# Patient Record
Sex: Male | Born: 1970 | Race: Black or African American | Hispanic: No | Marital: Single | Smoking: Current every day smoker
Health system: Southern US, Community
[De-identification: ages and names within clinical notes are randomized; demographics above are authoritative.]

## PROBLEM LIST (undated history)

## (undated) DIAGNOSIS — M5136 Other intervertebral disc degeneration, lumbar region: Secondary | ICD-10-CM

---

## 2000-01-12 ENCOUNTER — Emergency Department (HOSPITAL_COMMUNITY): Admission: EM | Admit: 2000-01-12 | Discharge: 2000-01-13 | Payer: Self-pay | Admitting: Emergency Medicine

## 2000-01-12 ENCOUNTER — Encounter: Payer: Self-pay | Admitting: Emergency Medicine

## 2002-05-16 ENCOUNTER — Emergency Department (HOSPITAL_COMMUNITY): Admission: EM | Admit: 2002-05-16 | Discharge: 2002-05-16 | Payer: Self-pay | Admitting: Emergency Medicine

## 2003-10-14 ENCOUNTER — Emergency Department (HOSPITAL_COMMUNITY): Admission: EM | Admit: 2003-10-14 | Discharge: 2003-10-14 | Payer: Self-pay | Admitting: Emergency Medicine

## 2016-06-11 ENCOUNTER — Emergency Department (HOSPITAL_COMMUNITY)
Admission: EM | Admit: 2016-06-11 | Discharge: 2016-06-11 | Disposition: A | Discharge status: Home / Self Care | Payer: Self-pay | Attending: Dermatology | Admitting: Dermatology

## 2016-06-11 ENCOUNTER — Encounter (HOSPITAL_COMMUNITY): Payer: Self-pay | Admitting: Emergency Medicine

## 2016-06-11 DIAGNOSIS — Y939 Activity, unspecified: Secondary | ICD-10-CM | POA: Insufficient documentation

## 2016-06-11 DIAGNOSIS — Z5321 Procedure and treatment not carried out due to patient leaving prior to being seen by health care provider: Secondary | ICD-10-CM | POA: Insufficient documentation

## 2016-06-11 DIAGNOSIS — Y990 Civilian activity done for income or pay: Secondary | ICD-10-CM | POA: Insufficient documentation

## 2016-06-11 DIAGNOSIS — F17200 Nicotine dependence, unspecified, uncomplicated: Secondary | ICD-10-CM | POA: Insufficient documentation

## 2016-06-11 DIAGNOSIS — T1591XA Foreign body on external eye, part unspecified, right eye, initial encounter: Secondary | ICD-10-CM | POA: Insufficient documentation

## 2016-06-11 DIAGNOSIS — Y929 Unspecified place or not applicable: Secondary | ICD-10-CM | POA: Insufficient documentation

## 2016-06-11 DIAGNOSIS — X58XXXA Exposure to other specified factors, initial encounter: Secondary | ICD-10-CM | POA: Insufficient documentation

## 2016-06-11 HISTORY — DX: Other intervertebral disc degeneration, lumbar region: M51.36

## 2016-06-11 NOTE — ED Triage Notes (Signed)
Pt. reports foreign object hit his right eye this afternoon while while working on a project , denies vision loss, presents with redness/teary right eye .

## 2016-06-11 NOTE — ED Notes (Signed)
Pt threw stuff on desk and said he is gone, he will be back another day.

## 2016-06-12 ENCOUNTER — Emergency Department (HOSPITAL_COMMUNITY): Payer: Self-pay

## 2016-06-12 ENCOUNTER — Emergency Department (HOSPITAL_COMMUNITY)
Admission: EM | Admit: 2016-06-12 | Discharge: 2016-06-12 | Disposition: A | Discharge status: Home / Self Care | Payer: Self-pay | Attending: Emergency Medicine | Admitting: Emergency Medicine

## 2016-06-12 ENCOUNTER — Encounter (HOSPITAL_COMMUNITY): Payer: Self-pay | Admitting: Emergency Medicine

## 2016-06-12 DIAGNOSIS — G9389 Other specified disorders of brain: Secondary | ICD-10-CM

## 2016-06-12 DIAGNOSIS — W228XXA Striking against or struck by other objects, initial encounter: Secondary | ICD-10-CM | POA: Insufficient documentation

## 2016-06-12 DIAGNOSIS — G939 Disorder of brain, unspecified: Secondary | ICD-10-CM | POA: Insufficient documentation

## 2016-06-12 DIAGNOSIS — T1591XA Foreign body on external eye, part unspecified, right eye, initial encounter: Secondary | ICD-10-CM | POA: Insufficient documentation

## 2016-06-12 DIAGNOSIS — F17200 Nicotine dependence, unspecified, uncomplicated: Secondary | ICD-10-CM | POA: Insufficient documentation

## 2016-06-12 DIAGNOSIS — Y999 Unspecified external cause status: Secondary | ICD-10-CM | POA: Insufficient documentation

## 2016-06-12 DIAGNOSIS — Y929 Unspecified place or not applicable: Secondary | ICD-10-CM | POA: Insufficient documentation

## 2016-06-12 DIAGNOSIS — Y9389 Activity, other specified: Secondary | ICD-10-CM | POA: Insufficient documentation

## 2016-06-12 MED ORDER — LORAZEPAM 2 MG/ML IJ SOLN
1.0000 mg | Freq: Once | INTRAMUSCULAR | Status: AC
  Administered 2016-06-12: 1 mg via INTRAVENOUS
  Filled 2016-06-12: qty 1

## 2016-06-12 MED ORDER — HYDROCODONE-ACETAMINOPHEN 5-325 MG PO TABS: 2.0000 | ORAL_TABLET | ORAL | 0 refills | Status: AC | PRN

## 2016-06-12 MED ORDER — ERYTHROMYCIN 5 MG/GM OP OINT: TOPICAL_OINTMENT | OPHTHALMIC | 0 refills | Status: AC

## 2016-06-12 MED ORDER — LIDOCAINE HCL (PF) 1 % IJ SOLN
5.0000 mL | Freq: Once | INTRAMUSCULAR | Status: AC
  Administered 2016-06-12: 5 mL
  Filled 2016-06-12: qty 30

## 2016-06-12 NOTE — ED Notes (Signed)
Patient transported to CT 

## 2016-06-12 NOTE — ED Provider Notes (Signed)
[facility]-EMERGENCY DEPT Provider Note   CSN: [id number] Arrival date & time: 06/12/16  1032  By signing my name below, I, Sonum Patel, attest that this documentation has been prepared under the direction and in the presence of Ponciano Ort McIntosh, [place] . Electronically Signed: Sonum Patel, Neurosurgeon. 06/12/16. 1:20 PM.  History   Chief Complaint Chief Complaint  Patient presents with  . Eye Injury    right     The history is provided by the patient. No language interpreter was used.     HPI Comments: Alex Long is a 46 y.o. male who presents to the Emergency Department complaining of a right eye injury that occurred yesterday. Patient states he was hammering wood when he was struck with a piece of wood. He complains of pain with associated redness to the right eye and eyelid. He states it feels as though there is something in his eye. He denies any modifying factors.   Past Medical History:  Diagnosis Date  . DDD (degenerative disc disease), lumbar     There are no active problems to display for this patient.   History reviewed. No pertinent surgical history.     Home Medications    Prior to Admission medications   Not on File    Family History No family history on file.  Social History Social History  Substance Use Topics  . Smoking status: Current Every Day Smoker  . Smokeless tobacco: Never Used  . Alcohol use Yes     Allergies   Bee venom and Tramadol   Review of Systems Review of Systems  Eyes: Positive for pain and redness. Negative for photophobia, discharge and visual disturbance.  Neurological: Negative for syncope and headaches.  All other systems reviewed and are negative.    Physical Exam Updated Vital Signs BP 113/90   Pulse 73   Temp 98.4 F (36.9 C)   Resp 16   SpO2 100%   Physical Exam  Constitutional: He is oriented to person, place, and time. He appears well-developed and well-nourished.  HENT:  Head: Normocephalic and  atraumatic.  Eyes: EOM are normal. Pupils are equal, round, and reactive to light. Foreign body present in the right eye.  Right eye: Blue-ish foreign body at approximately 9 o'clock, outside of iris area   Cardiovascular: Normal rate.   Pulmonary/Chest: Effort normal.  Neurological: He is alert and oriented to person, place, and time.  Skin: Skin is warm and dry.  Psychiatric: He has a normal mood and affect.  Nursing note and vitals reviewed.    ED Treatments / Results  DIAGNOSTIC STUDIES: Oxygen Saturation is 100% on RA, normal by my interpretation.    COORDINATION OF CARE: 1:21 PM Discussed treatment plan with pt at bedside and pt agreed to plan.    Labs (all labs ordered are listed, but only abnormal results are displayed) Labs Reviewed - No data to display  EKG  EKG Interpretation None       Radiology Dg Orbits  Result Date: 06/12/2016 CLINICAL DATA:  Right eye injury. EXAM: ORBITS - COMPLETE 4+ VIEW COMPARISON:  No prior. FINDINGS: Radiopaque densities are noted in the right foot consistent with foreign body. No acute bony abnormality identified. Paranasal sinuses clear. IMPRESSION: Small radiopaque densities noted in the right orbit consistent with foreign bodies. These could be intra-ocular. These results will be called to the ordering clinician or representative by the Radiologist Assistant, and communication documented in the PACS or zVision Dashboard. Electronically Signed  ByMaisie Fus: Thomas  Register   On: 06/12/2016 14:00   Ct Orbits Wo Contrast  Result Date: 06/12/2016 CLINICAL DATA:  Foreign body in the right globe. EXAM: CT ORBITS WITHOUT CONTRAST TECHNIQUE: Multidetector CT images were obtained using the standard protocol without intravenous contrast. COMPARISON:  None. FINDINGS: Orbits: A metallic foreign body measuring 2.7 mm is evident along the lateral aspect of the lens insertion and the right globe. The globe appears to be intact. The lens is located. The optic  nerves are within normal limits. The intraorbital musculature is normal. Visualized sinuses: A small polyp or mucous retention cyst is seen along the lateral aspect of the left maxillary sinus. There is minimal mucosal thickening along the floor of the left maxillary sinus. A small polyp or mucous retention cyst is present at the anterior right maxillary sinus. The ostiomeatal complex is patent bilaterally. Concha bullosa are present in the middle turbinates. The nasal cavity is clear. Soft tissues: The periorbital soft tissues are otherwise within normal limits. Limited intracranial: A low-density region is present in the left frontal lobe. This appears to be parenchymal but is incompletely imaged. IMPRESSION: 1. 2.7 mm metallic foreign body along the lateral aspect of the right globe near the insertion of the lens. 2. 3.5 cm hypodensity in the left frontal lobe. This may reflect a remote infarct or less likely low-grade tumor. Arachnoid cyst could have this appearance. Recommend MRI of the brain without and with contrast following removal of the metallic foreign body. Electronically Signed   By: Marin Robertshristopher  Mattern M.D.   On: 06/12/2016 15:56    Procedures Procedures (including critical care time)  Medications Ordered in ED Medications - No data to display   Initial Impression / Assessment and Plan / ED Course  I have reviewed the triage vital signs and the nursing notes.  Pertinent labs & imaging results that were available during my care of the patient were reviewed by me and considered in my medical decision making (see chart for details).  Clinical Course   Pt counseled on foreign body.  Pt advised of findings of brain abnormality.      Final Clinical Impressions(s) / ED Diagnoses   Final diagnoses:  Foreign body, eye, right, initial encounter  Foreign body of right eye, initial encounter  Brain mass  Dr. Freida BusmanAllen in to see. Dr. Genia [facility] here to see to remove foreign body.    I discussed need for MRi with pt.  Pt wants to have done outpatient.  He will call and schedule appointment.   Pt advised to return to ED if any problems.   New Prescriptions There are no discharge medications for this patient.  No outpatient prescriptions have been marked as taking for the 06/12/16 encounter [facility](Hospital Encounter).   I personally performed the services in this documentation, which was scribed in my presence.  The recorded information has been reviewed and considered.   Barnet PallKaren SofiaPAC. An After Visit Summary was printed and given to the patient.    Lonia SkinnerLeslie K AlbionSofia, PA-C 06/12/16 1922    Lorre NickAnthony Allen, MD 06/15/16 1045

## 2016-06-12 NOTE — ED Notes (Signed)
Opthalmology at bedside.

## 2016-06-12 NOTE — Discharge Instructions (Signed)
You need to have a MRi with and without contrast to evaluate the area seen today on your brain.   If you can not get scheduled, return here to have done.

## 2016-06-12 NOTE — Consult Note (Signed)
Chief Complaint  Patient presents with  . Eye Injury    right   :       Ophthalmology HPI: This is a 46 y.o.  male with a past ocular history listed below that presents with eye pain, blurry vision and red eye after a piece of metal hit is right eye. Patient was hammering metal and a piece hit is right eye two days ago. He has had a little blurry vision. He has constant fbs.  This happened yesterday.   Denies flashes of light, floaters, double vision, curtains coming over vision.    Past Ocular History: Refractive error    Last Eye Exam:  2 years ago    Primary Eye Care:  Unknown    Past Medical History:  Diagnosis Date  . DDD (degenerative disc disease), lumbar      History reviewed. No pertinent surgical history.   Social History   Social History  . Marital status: Single    Spouse name: N/A  . Number of children: N/A  . Years of education: N/A   Occupational History  . Not on file.   Social History Main Topics  . Smoking status: Current Every Day Smoker  . Smokeless tobacco: Never Used  . Alcohol use Yes  . Drug use:     Types: Marijuana  . Sexual activity: Not on file   Other Topics Concern  . Not on file   Social History Narrative  . No narrative on file     Allergies  Allergen Reactions  . Bee Venom   . Tramadol      No current facility-administered medications on file prior to encounter.    No current outpatient prescriptions on file prior to encounter.     Review of Systems  Constitutional: Negative.   Eyes: Positive for blurred vision, pain and redness.  All other systems reviewed and are negative.     Exam:  General: Awake, Alert, Oriented *3  Vision (near): without correction   OD: 4 point  OS: 3 point  Confrontational Field:   Full to count fingers, both eyes  Extraocular Motility:  Full ductions and versions, both eyes    External:   Normal Symmetry, V1-3 intact, infraorbital nerve appears intact.     Pupils  OD: 4mm to 3mm reactive without afferent pupillary defect (APD)  OS: 4mm to 3mm reactive without afferent pupillary defect (APD)   IOP(tonopen)  OD: 16  OS: 15  Slit Lamp Exam:  Lids/Lashes  OD: Normal Lids and lashes, No lesion or injury  OS: Normal lids and lashes, nor lesion or injury  Conjucntiva/Sclera  OD:1+ injection, 2.64mm subconj metalic foreign bodytemporal conj, 3mm from limbus  OS: White and quiet  Cornea  OD: Clear without abrasion or defect  OS: Clear without abrasion or defect  Anterior Chamber  OD: Deep and quiet  OS: Deep and quiet  Iris  OD: Normal iris architecture  OS: Normal Iris Architecture   Lens  OD: Clear, Without significant opacities  OS: Clear, Without significant opacities  Anterior Vitreous  OD: Clear, without cell  OS: Clear without cell   POSTERIOR POLE EXAM (Dialated with phenylephrine and tropicamide.Dilation may last up to 24 hours)  View:   OD: 20/20 view without opacities  OS: 20/20 view without opacities  Vitreous:   OD: Clear, no cell  OS: Clear, no cell  Disc:   OD: flat, sharp margin, with appropriate color   NO DISC EDEMA, +SVP  OS:  flat, sharp margin, with appropriate color   No DISC EDEMA, +SVP  C:D Ratio:   OD: 0.2   OS: 0.2  Macula  OD: Flat, with appropriate light reflex  OS: Flat with appropriate light reflex  Vessels  OD: Normal vasculature  OS: Normal vasculature  Periphery  OD: Flat 360 degrees without tear, hole or detachment  OS: Flat 360 degrees without tear, hole or detachment    CT Scan:  - Conjucntival metalic foreign body without intraocular involvement.  -Incidental Frontal lobe mass detected. See Radiology for full report.    Assessment and Plan:   This is 46 y.o.  male with conjunctival foreign body right eye.  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    Conj Foreign Body - Discussed iagnosis, prognosis, and treatment options. Recommend  removal for conjunctival foreign body.  -Conj foreign body removed without incident (see below procedure note).  -Erythromycin ointment TID x 7 days Remove patch in the morning.  - Follow up outpatient    Frontal Lobe Mass - No evidence of increased intracranial pressure on opthalmic exam - Recommend MRI and managemetn per primary team, will defer to ED for further work up.     Procedure Note Patient was identified and correct sight was marked. The eye was prepped in the usual sterile fashion. A retrobulbar injection of lidocaine was injected in to the retrobulbar space for adequate anesthesia and akinesia. A lid speculum was placed into the eye.Wescot scissors were used to incise the conjucntiva. Toothed forceps were used to free and remove a 2.985mm metalic foreign body.  Hemostasis was achieved. Erythromycin ointment was placed into the eye and the eye was patched.   Instructed to remove patch in

## 2016-06-12 NOTE — ED Triage Notes (Signed)
Pt reports right eye injury , believes foreign object , piece of wood per pt. Presents with obvious redness . Reports blurry vision since incident.

## 2016-06-21 ENCOUNTER — Ambulatory Visit (HOSPITAL_COMMUNITY): Admission: RE | Admit: 2016-06-21 | Payer: Self-pay | Source: Ambulatory Visit

## 2016-06-28 ENCOUNTER — Ambulatory Visit (HOSPITAL_COMMUNITY): Admission: RE | Admit: 2016-06-28 | Payer: Self-pay | Source: Ambulatory Visit

## 2018-07-12 IMAGING — CT CT ORBITS W/O CM
3 series · 14 of 47 positions shown, 16 images · non-contrast
Comparison: None.

CLINICAL DATA: Foreign body in the right globe.

EXAM:
CT ORBITS WITHOUT CONTRAST
TECHNIQUE: Multidetector CT images were obtained using the standard protocol
without intravenous contrast.

[Series 3: orbits st · axial · 0.23mm/px · z∈[-43,+33]mm · 8 of 46 slices shown, 10 images]
[im 4/46  brain]
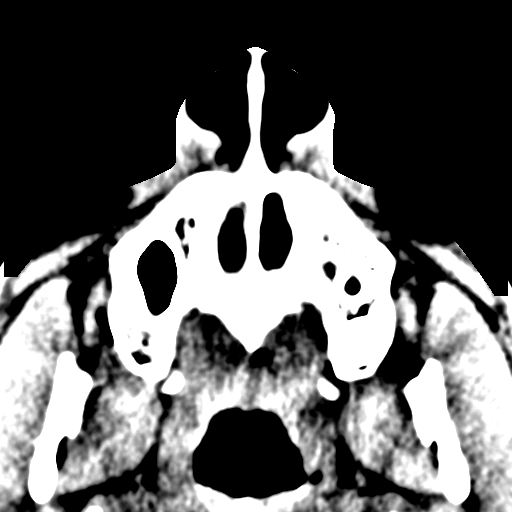
[im 4/46  bone]
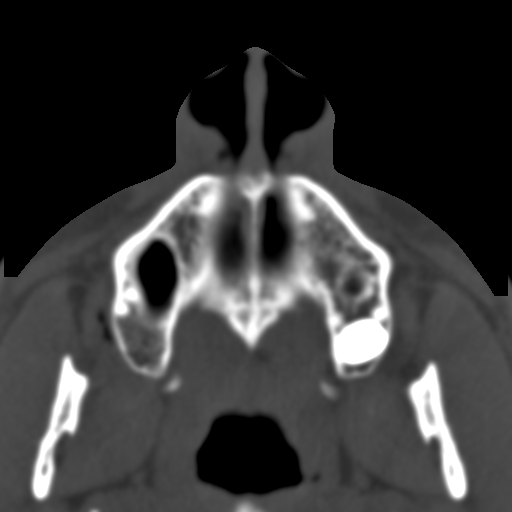
[im 10/46  bone]
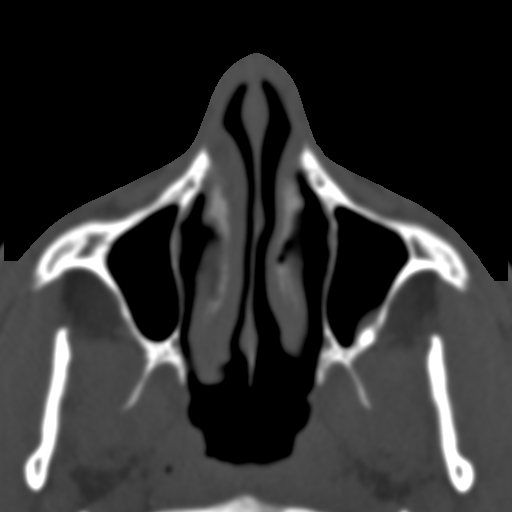
[im 14/46  bone]
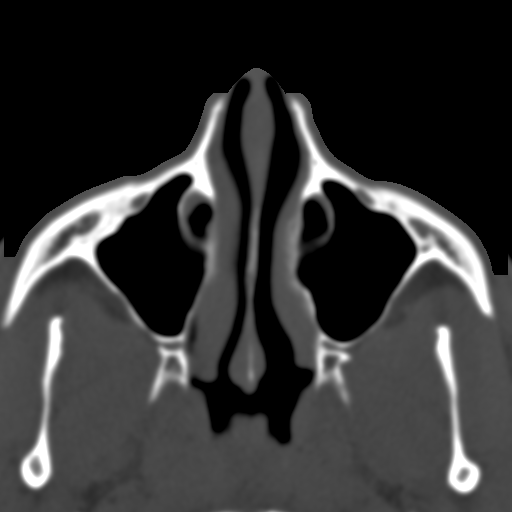
[im 21/46  bone]
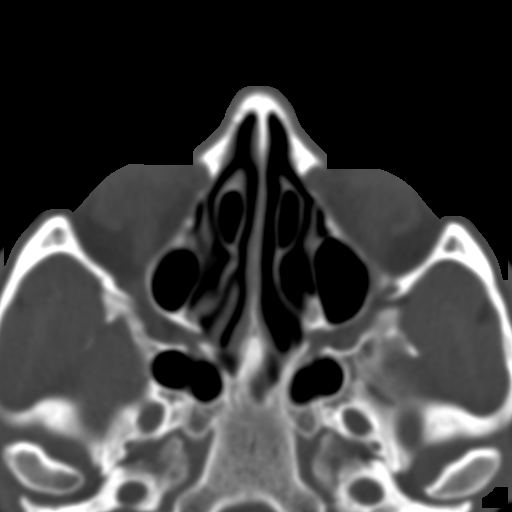
[im 25/46  brain]
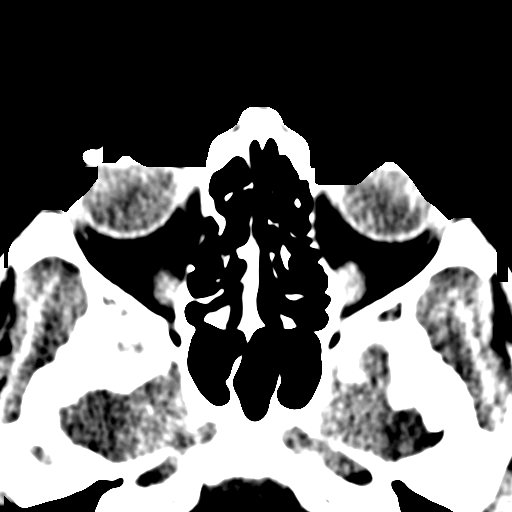
[im 25/46  bone]
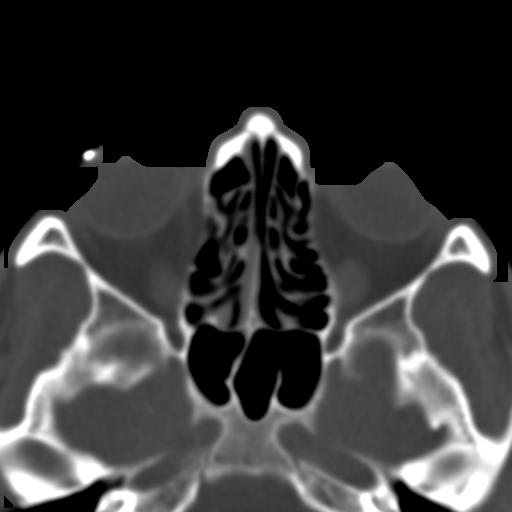
[im 32/46  bone]
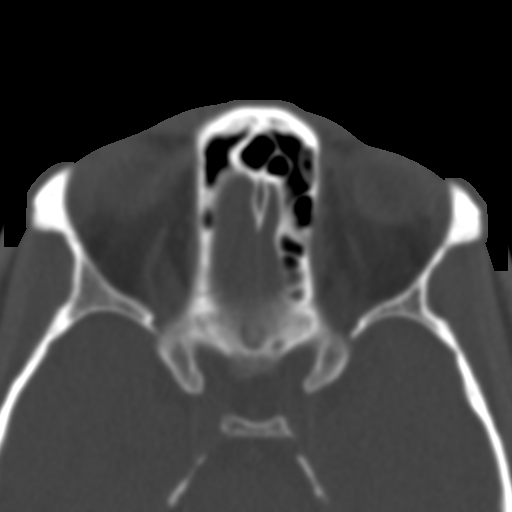
[im 36/46  bone]
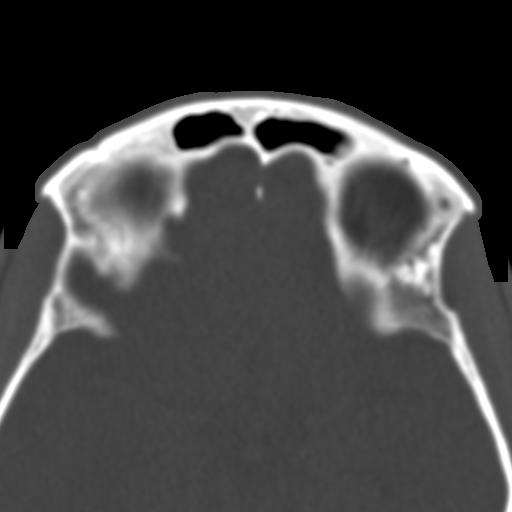
[im 42/46  bone]
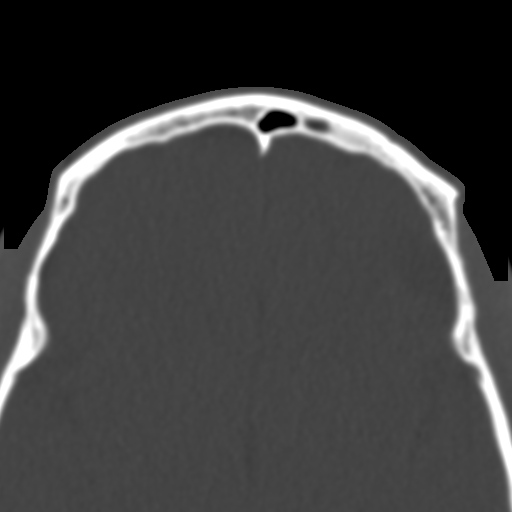

[Series 5: coronal st · coronal · 0.21mm/px · 3 of 76 slices shown]
[im 26/76  bone]
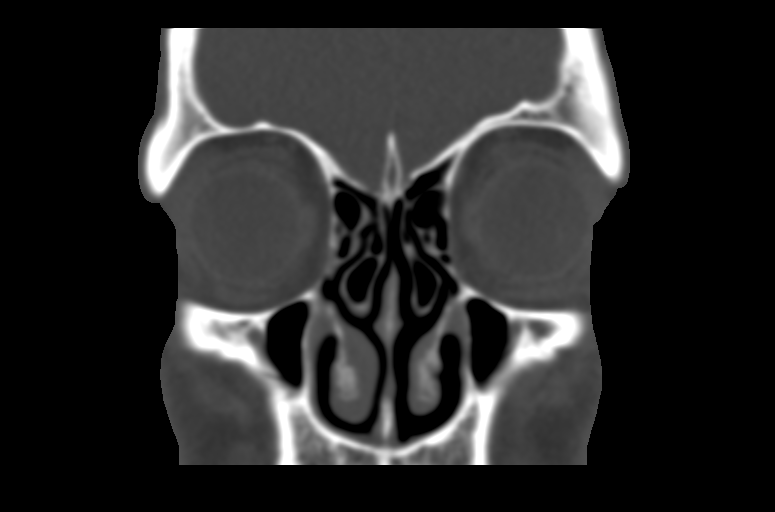
[im 34/76  bone]
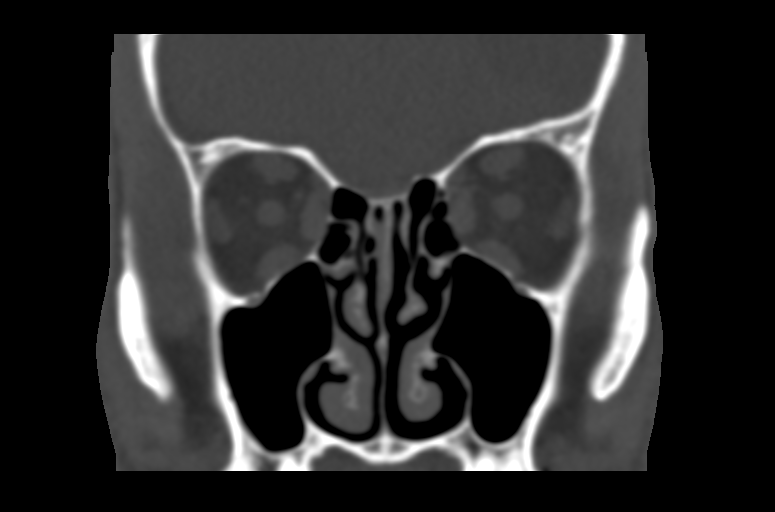
[im 42/76  bone]
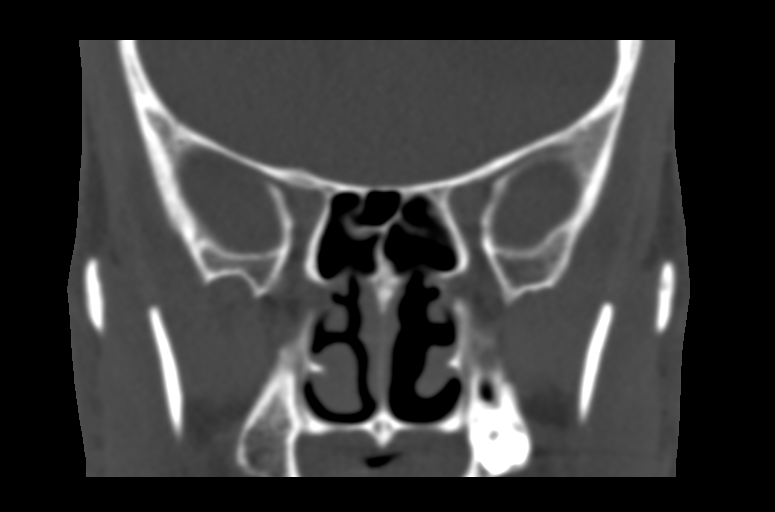

[Series 6: sagittal st · sagittal · 0.20mm/px · 3 of 76 slices shown]
[im 26/76  bone]
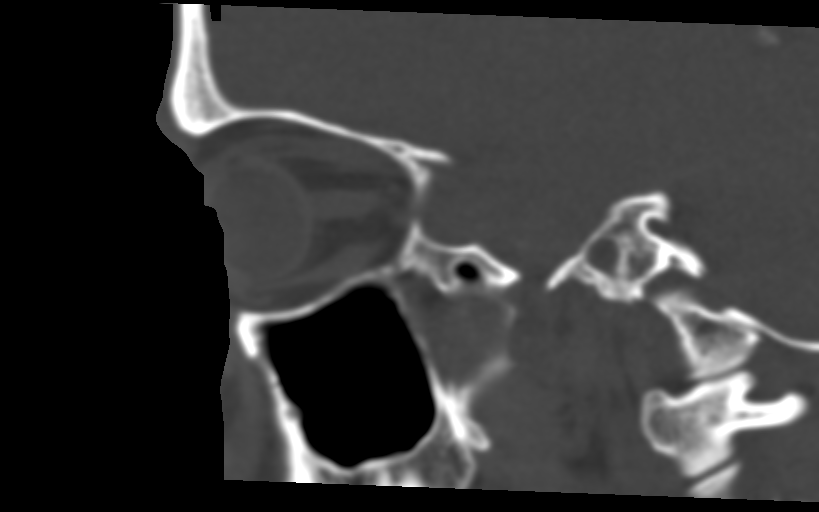
[im 38/76  bone]
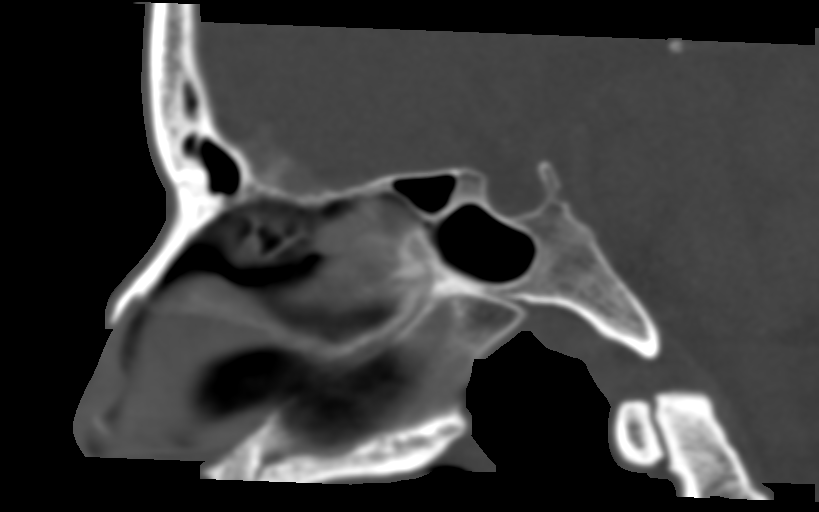
[im 51/76  bone]
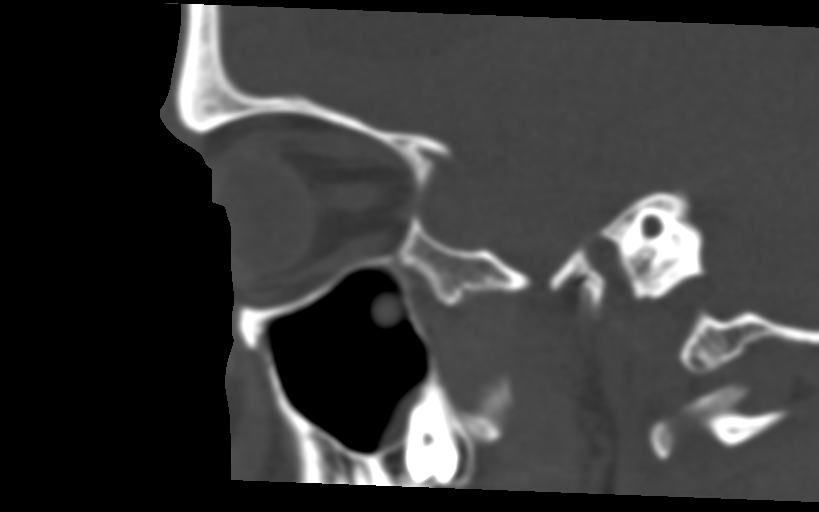

[14 of 47 positions shown; findings below may reference images not displayed]

FINDINGS: Orbits: A metallic foreign body measuring 2.7 mm is evident along
the lateral aspect of the lens insertion and the right globe. The
globe appears to be intact. The lens is located. The optic nerves
are within normal limits. The intraorbital musculature is normal.

Visualized sinuses: A small polyp or mucous retention cyst is seen
along the lateral aspect of the left maxillary sinus. There is
minimal mucosal thickening along the floor of the left maxillary
sinus. A small polyp or mucous retention cyst is present at the
anterior right maxillary sinus. The ostiomeatal complex is patent
bilaterally. Concha bullosa are present in the middle turbinates.
The nasal cavity is clear.

Soft tissues: The periorbital soft tissues are otherwise within
normal limits.

Limited intracranial: A low-density region is present in the left
frontal lobe. This appears to be parenchymal but is incompletely
imaged.
IMPRESSION: 1. 2.7 mm metallic foreign body along the lateral aspect of the
right globe near the insertion of the lens.
2. 3.5 cm hypodensity in the left frontal lobe. This may reflect a
remote infarct or less likely low-grade tumor. Arachnoid cyst could
have this appearance. Recommend MRI of the brain without and with
contrast following removal of the metallic foreign body.

## 2020-01-18 ENCOUNTER — Encounter (HOSPITAL_COMMUNITY): Payer: Self-pay

## 2020-01-18 ENCOUNTER — Emergency Department (HOSPITAL_COMMUNITY): Payer: PRIVATE HEALTH INSURANCE

## 2020-01-18 ENCOUNTER — Other Ambulatory Visit: Payer: Self-pay

## 2020-01-18 ENCOUNTER — Emergency Department (HOSPITAL_COMMUNITY)
Admission: EM | Admit: 2020-01-18 | Discharge: 2020-01-18 | Disposition: A | Discharge status: Home / Self Care | Payer: PRIVATE HEALTH INSURANCE | Attending: Emergency Medicine | Admitting: Emergency Medicine

## 2020-01-18 DIAGNOSIS — R52 Pain, unspecified: Secondary | ICD-10-CM | POA: Insufficient documentation

## 2020-01-18 DIAGNOSIS — R5383 Other fatigue: Secondary | ICD-10-CM | POA: Insufficient documentation

## 2020-01-18 DIAGNOSIS — R6883 Chills (without fever): Secondary | ICD-10-CM | POA: Diagnosis not present

## 2020-01-18 DIAGNOSIS — F17200 Nicotine dependence, unspecified, uncomplicated: Secondary | ICD-10-CM | POA: Insufficient documentation

## 2020-01-18 DIAGNOSIS — R0981 Nasal congestion: Secondary | ICD-10-CM | POA: Insufficient documentation

## 2020-01-18 DIAGNOSIS — Z20822 Contact with and (suspected) exposure to covid-19: Secondary | ICD-10-CM | POA: Insufficient documentation

## 2020-01-18 DIAGNOSIS — R05 Cough: Secondary | ICD-10-CM | POA: Diagnosis not present

## 2020-01-18 DIAGNOSIS — J069 Acute upper respiratory infection, unspecified: Secondary | ICD-10-CM

## 2020-01-18 LAB — SARS CORONAVIRUS 2 BY RT PCR (HOSPITAL ORDER, PERFORMED IN ~~LOC~~ HOSPITAL LAB): SARS Coronavirus 2: NEGATIVE

## 2020-01-18 NOTE — ED Triage Notes (Signed)
Pt c/o body aches, runny nose, diarrhea and fatigue.  Pt states he had to leave work early today and needs work note.  A/Ox4 Ambulatory in triage.

## 2020-01-18 NOTE — ED Provider Notes (Signed)
[facility]-EMERGENCY DEPT Provider Note   CSN: [id number] Arrival date & time: 01/18/20  1307     History Chief Complaint  Patient presents with  . COVIDSX    Alex Long is a 49 y.o. male with no relevant past medical history presents to the ED with a 3-day history of nasal congestion, cough, fatigue, and chills. Patient is also endorsing body aches. He states that he has not had any illness in over 3 years and he is now concerned about flu or COVID-19. He told his wife he would wait until he became symptomatic before considering the vaccine. He has been checking his temperature at home and denies any fevers. He also denies any shortness of breath or difficulty breathing, chest pain, history of clots or clotting disorder, abdominal pain, unilateral extremity swelling or edema, or other symptoms. He is a 1 pack/day smoker x 15 years.   HPI     Past Medical History:  Diagnosis Date  . DDD (degenerative disc disease), lumbar     There are no problems to display for this patient.   History reviewed. No pertinent surgical history.     History reviewed. No pertinent family history.  Social History   Tobacco Use  . Smoking status: Current Every Day Smoker  . Smokeless tobacco: Never Used  Substance Use Topics  . Alcohol use: Yes  . Drug use: Yes    Types: Marijuana    Home Medications Prior to Admission medications   Medication Sig Start Date End Date Taking? Authorizing Provider  erythromycin ophthalmic ointment Place a 1/2 inch ribbon of ointment into the lower eyelid. 06/12/16   Elson Areas, PA-C  HYDROcodone-acetaminophen (NORCO/VICODIN) 5-325 MG tablet Take 2 tablets by mouth every 4 (four) hours as needed. 06/12/16   Elson Areas, PA-C    Allergies    Bee venom and Tramadol  Review of Systems   Review of Systems  All other systems reviewed and are negative.   Physical Exam Updated Vital Signs BP (!) 151/95   Pulse 72   Temp  99.4 F (37.4 C)   Resp 18   SpO2 97%   Physical Exam Vitals and nursing note reviewed. Exam conducted with a chaperone present.  Constitutional:      General: He is not in acute distress.    Appearance: He is ill-appearing.  HENT:     Head: Normocephalic and atraumatic.  Eyes:     General: No scleral icterus.    Conjunctiva/sclera: Conjunctivae normal.  Cardiovascular:     Rate and Rhythm: Normal rate and regular rhythm.     Pulses: Normal pulses.     Heart sounds: Normal heart sounds.  Pulmonary:     Comments: No increased work of breathing. No wheezing or rales appreciated. Breath sounds intact bilaterally. Symmetric chest rise. Speaks in full sentences. Abdominal:     General: Abdomen is flat. There is no distension.     Palpations: Abdomen is soft.     Tenderness: There is no abdominal tenderness.  Musculoskeletal:     Cervical back: Normal range of motion. No rigidity.     Right lower leg: No edema.     Left lower leg: No edema.  Skin:    General: Skin is dry.     Capillary Refill: Capillary refill takes less than 2 seconds.  Neurological:     Mental Status: He is alert and oriented to person, place, and time.     GCS: GCS  eye subscore is 4. GCS verbal subscore is 5. GCS motor subscore is 6.  Psychiatric:        Mood and Affect: Mood normal.        Behavior: Behavior normal.        Thought Content: Thought content normal.     ED Results / Procedures / Treatments   Labs (all labs ordered are listed, but only abnormal results are displayed) Labs Reviewed  SARS CORONAVIRUS 2 BY RT PCR (HOSPITAL ORDER, PERFORMED IN [facility])    EKG None  Radiology DG Chest Portable 1 View  Result Date: 01/18/2020 CLINICAL DATA:  Cough, rhinorrhea, body aches, fatigue EXAM: PORTABLE CHEST 1 VIEW COMPARISON:  None. FINDINGS: The heart size and mediastinal contours are within normal limits. Both lungs are clear. The visualized skeletal structures are unremarkable.  IMPRESSION: No active disease. Electronically Signed   By: Sharlet Salina M.D.   On: 01/18/2020 17:53    Procedures Procedures (including critical care time)  Medications Ordered in ED Medications - No data to display  ED Course  I have reviewed the triage vital signs and the nursing notes.  Pertinent labs & imaging results that were available during my care of the patient were reviewed by me and considered in my medical decision making (see chart for details).    MDM Rules/Calculators/A&P                          Patient will be tested for COVID-19 via PCR.  Plain films obtained of chest were negative for pneumonia or other acute cardiopulmonary findings.  Patient's vital signs are stable and reassuring.  He is resting comfortably in no acute distress.    Given brevity of illness, do not feel as though further work-up is warranted.  His symptoms are consistent with viral URI.  Discussed that antibiotics are not indicated.  Patient will be discharged with symptomatic treatment.  Patient is tolerating food and liquid without difficulty and I do not believe that laboratory work-up would yield any significant findings.  I emphasized the importance of rest, continued oral hydration, and antipyretics as needed for fever control.    They were provided opportunity to ask any additional questions and have none at this time.  Prior to discharge patient is feeling well, agreeable with plan for discharge home.  They have expressed understanding of verbal discharge instructions as well as return precautions and are agreeable to the plan.   The patient was counseled on the dangers of tobacco use, and was advised to quit.  Reviewed strategies to maximize success, including removing cigarettes and smoking materials from environment, stress management, substitution of other forms of reinforcement, support of family/friends and written materials. Total time was 5 min CPT code 25366.   Alex Long was  evaluated in Emergency Department on 01/18/2020 for the symptoms described in the history of present illness. He was evaluated in the context of the global COVID-19 pandemic, which necessitated consideration that the patient might be at risk for infection with the SARS-CoV-2 virus that causes COVID-19. Institutional protocols and algorithms that pertain to the evaluation of patients at risk for COVID-19 are in a state of rapid change based on information released by regulatory bodies including the CDC and federal and state organizations. These policies and algorithms were followed during the patient's care in the ED.   Final Clinical Impression(s) / ED Diagnoses Final diagnoses:  None    Rx /  DC Orders ED Discharge Orders    None       Elvera Maria 01/18/20 1806    Wynetta Fines, MD 01/18/20 2142

## 2020-01-18 NOTE — Discharge Instructions (Signed)
Please read the attachment on upper respiratory infections.  Be sure to continue checking your temperature regularly to assess for any fevers.  Take Tylenol or ibuprofen as needed for fever control.  I recommend taking DayQuil/NyQuil, Delsym, or Mucinex as needed for symptomatic management.  You can perform nasal rinses for your nasal and sinus congestion.  While you are negative for COVID-19, I would like for you to continue maintaining isolation precautions for the next 48 hours given the current surge.  Once you are feeling improved, I strongly encourage you to consider COVID-19 vaccination.  Return to the ED or seek immediate medical attention should you experience any new or worsening symptoms.

## 2021-03-11 ENCOUNTER — Emergency Department (HOSPITAL_COMMUNITY)
Admission: EM | Admit: 2021-03-11 | Discharge: 2021-04-04 | Disposition: E | Payer: PRIVATE HEALTH INSURANCE | Attending: Emergency Medicine | Admitting: Emergency Medicine

## 2021-03-11 DIAGNOSIS — I469 Cardiac arrest, cause unspecified: Secondary | ICD-10-CM | POA: Insufficient documentation

## 2021-03-11 DIAGNOSIS — F17200 Nicotine dependence, unspecified, uncomplicated: Secondary | ICD-10-CM | POA: Diagnosis not present

## 2021-04-04 NOTE — Progress Notes (Signed)
Situation: Chaplain Medinas-Lockley responding to page from ED regarding death of pt Chioke Noxon.  Background: Facts: Per nurse, pt Mr. Benedict had died and family was in consult room waiting to view his body. Family shared that Mr. Kamal "just had a birthday" and "was only 50 years old". Family: Approximately 8 family members were present, including Mr. Romel wife (DeeDee), her sister, Mr. Dallen parents, several family friends, and Mr. Kaled 5 yo nephew Rayna Sexton) who lives in the home with them (Mr. Fernado and [place]). Feelings: Family expressed much sadness, shedding tears. Rayna Sexton was also tearful at times and also joked around and laughed some. One family member shared, "Tomorrow is not guaranteed" as she reflected on the fragility of life. Faith: Mr. Rosario family Minister Rogelio Seen was also present and offered spiritual support, calling the funeral home and offering prayer.  Actions & Assessments: Family is actively grieving this unexpected loss. 50 yo Rayna Sexton seems to go back and forth between being tearful and joking, seemingly trying to cheer up his family that he sees is grieving so much at this time.  Recommendations: Chaplain remains available for follow-up spiritual/emotional support as needed.  Rev. Mayme Genta, MDiv      2021/04/03 0800  Clinical Encounter Type  Visited With Family  Visit Type Initial;ED;Death  Referral From Nurse  Spiritual Encounters  Spiritual Needs Grief support;Emotional

## 2021-04-04 NOTE — ED Notes (Signed)
IO placed by EMS PTA to [facility]

## 2021-04-04 NOTE — ED Notes (Signed)
Pt is an ME Case per ME

## 2021-04-04 NOTE — ED Provider Notes (Addendum)
[facility] EMERGENCY DEPARTMENT Provider Note  CSN: [id number] Arrival date & time:    Chief Complaint(s) No chief complaint on file.  HPI Alex Long is a 50 y.o. male brought in by EMS as cardiac arrest.  Last seen alive by wife at midnight.  She awoke in the middle of the night to find the patient unresponsive and not breathing.  EMS was called at 5:02 AM.  Wife began CPR.  When EMS arrived at approximately 5:06 AM, patient was in asystole.  They were unable to obtain a secure airway due to clenched jaw.  ACLS was continued and patient was given a total of 9 rounds of epinephrine.  Patient was easily bagged through nasal airway.  Patient was known for crack cocaine and marijuana use. Wife reported patient likely used earlier in the night.  Patient arrived to the emergency department at approximately 6:05 AM. He was in asystole. CPR in progress.  Remainder of history, ROS, and physical exam limited due to patient's condition (acuity of condition/cardiac arrest). Additional information was obtained from EMS.   Level V Caveat.   HPI  Past Medical History Past Medical History:  Diagnosis Date   DDD (degenerative disc disease), lumbar    There are no problems to display for this patient.  Home Medication(s) Prior to Admission medications   Medication Sig Start Date End Date Taking? Authorizing Provider  erythromycin ophthalmic ointment Place a 1/2 inch ribbon of ointment into the lower eyelid. 06/12/16   Elson Areas, PA-C  HYDROcodone-acetaminophen (NORCO/VICODIN) 5-325 MG tablet Take 2 tablets by mouth every 4 (four) hours as needed. 06/12/16   Elson Areas, PA-C                                                                                                                                    Past Surgical History No past surgical history on file. Family History No family history on file.  Social History Social History   Tobacco Use   Smoking  status: Every Day   Smokeless tobacco: Never  Substance Use Topics   Alcohol use: Yes   Drug use: Yes    Types: Marijuana   Allergies Bee venom and Tramadol  Review of Systems Review of Systems Unable to obtain due to cardiac arrest Physical Exam Vital Signs  I have reviewed the triage vital signs Temp 98 F (36.7 C)   Physical Exam Vitals reviewed.  Constitutional:      Appearance: He is well-developed. He is toxic-appearing.     Interventions: Nasal cannula in place.  HENT:     Head: Normocephalic and atraumatic.     Comments: Clenched jaw. JVD.     Right Ear: External ear normal.     Left Ear: External ear normal.     Nose: Nose normal.     Mouth/Throat:     Mouth: Mucous membranes are moist.  Eyes:  General: No scleral icterus.    Conjunctiva/sclera:     Right eye: Right conjunctiva is injected. Hemorrhage present.     Left eye: Left conjunctiva is injected. Hemorrhage present.     Comments: Dilated pupils at 60mm unreactive  Cardiovascular:     Comments: Pulseless Pulmonary:     Comments: Apneic Abdominal:     General: There is no distension.  Musculoskeletal:        General: No swelling or deformity.       Legs:  Neurological:     Mental Status: He is unresponsive.    ED Results and Treatments Labs (all labs ordered are listed, but only abnormal results are displayed) Labs Reviewed - No data to display                                                                                                                       EKG  EKG Interpretation  Date/Time:    Ventricular Rate:    PR Interval:    QRS Duration:   QT Interval:    QTC Calculation:   R Axis:     Text Interpretation:         Radiology No results found.  Pertinent labs & imaging results that were available during my care of the patient were reviewed by me and considered in my medical decision making (see MDM for details).  Medications Ordered in ED Medications - No data to  display                                                                                                                                   Procedures .1-3 Lead EKG Interpretation Performed by: Nira Conn, MD Authorized by: Nira Conn, MD     Interpretation: abnormal     ECG rate:  0   ECG rate assessment comment:  Asystole   Rhythm: asystole     Ectopy: none   CPR  Date/Time: Apr 06, 2021 6:28 AM Performed by: Nira Conn, MD Authorized by: Nira Conn, MD  CPR Procedure Details:      Amount of time prior to administration of ACLS/BLS (minutes):  60   ACLS/BLS initiated by EMS: Yes     CPR/ACLS performed in the ED: Yes     Duration of CPR (minutes):  71   Outcome: Pt declared dead    CPR performed via ACLS guidelines under my direct  supervision.  See RN documentation for details including defibrillator use, medications, doses and timing. .Critical Care Performed by: Nira Conn, MD Authorized by: Nira Conn, MD   Critical care provider statement:    Critical care time (minutes):  30   Critical care was necessary to treat or prevent imminent or life-threatening deterioration of the following conditions:  Cardiac failure   Critical care was time spent personally by me on the following activities:  Discussions with consultants, evaluation of patient's response to treatment, examination of patient, ordering and performing treatments and interventions, ordering and review of laboratory studies, ordering and review of radiographic studies, pulse oximetry, re-evaluation of patient's condition, obtaining history from patient or surrogate and review of old charts  (including critical care time)  Medical Decision Making / ED Course I have reviewed the nursing notes for this encounter and the patient's prior records (if available in EHR or on provided paperwork).  Alex Long was evaluated in Emergency Department on  Mar 29, 2021 for the symptoms described in the history of present illness. He was evaluated in the context of the global COVID-19 pandemic, which necessitated consideration that the patient might be at risk for infection with the SARS-CoV-2 virus that causes COVID-19. Institutional protocols and algorithms that pertain to the evaluation of patients at risk for COVID-19 are in a state of rapid change based on information released by regulatory bodies including the CDC and federal and state organizations. These policies and algorithms were followed during the patient's care in the ED.     Cardiac arrest Asystole. ACLS continued. Additional epinephrine given. Bicarb given. Rocuronium given in attempt to loosen clenched jaw to insert ET tube.  After 2 rounds of ACLS here in the emergency department, with a total of 10 epinephrines given since initial EMS contact, and over 1 hour of continuous resuscitative efforts, patient was unable to be revived.  Time of death 6:13 AM. Patient will be referred to the ME.  Set down and spoke to wife regarding patient's expiration.  Chaplain called for patient's family.  Spoke with the medical examiner and they will take the case.  Final Clinical Impression(s) / ED Diagnoses Final diagnoses:  Cardiac asystole [facility])     This chart was dictated using voice recognition software.  Despite best efforts to proofread,  errors can occur which can change the documentation meaning.   Nira Conn, MD Mar 29, 2021 [phone]

## 2021-04-04 NOTE — ED Triage Notes (Addendum)
Pt found down by wife around 0450, with LSW at 0000. Wife reports she started CPR immediately and EMS took over CPR at 0509. Initial rhythm Asystole.  9Epi 800NS NPA  79CBG

## 2021-04-04 NOTE — ED Notes (Signed)
6948 Pulse Check, Asystole 0609 CPR Resumed 0610 100mg  Roc Given  0611 1 Epi and 1 BiCarb 0612 Pulse Check Asystole 0613 TOD pronounced by Dr 

## 2021-04-04 DEATH — deceased

## 2022-02-16 IMAGING — DX DG CHEST 1V PORT
1 series · 1 of 1 positions shown · non-contrast
Comparison: None.

CLINICAL DATA: Cough, rhinorrhea, body aches, fatigue

EXAM:
PORTABLE CHEST 1 VIEW

[chest ap]
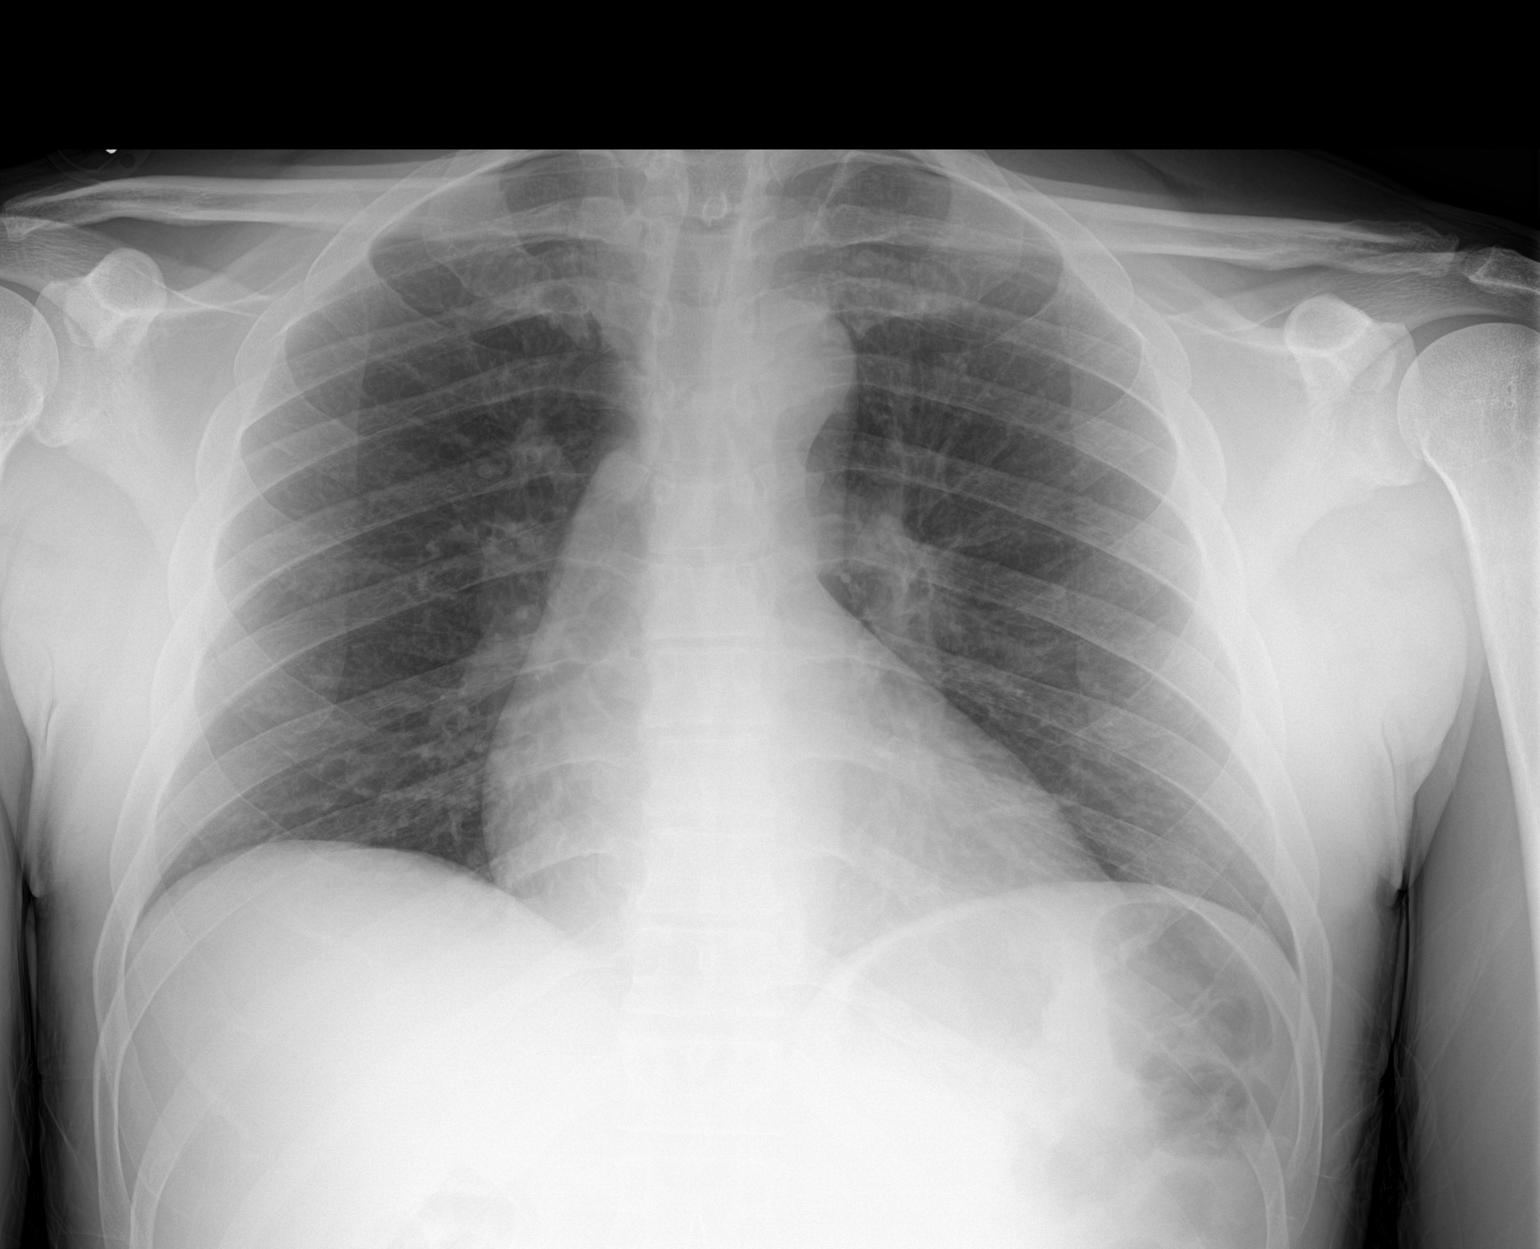

[1 of 1 positions shown; findings below may reference images not displayed]

FINDINGS: The heart size and mediastinal contours are within normal limits.
Both lungs are clear. The visualized skeletal structures are
unremarkable.
IMPRESSION: No active disease.
